# Patient Record
Sex: Male | Born: 2000 | Race: Black or African American | Hispanic: No | Marital: Single | Smoking: Never smoker
Health system: Southern US, Community
[De-identification: ages and names within clinical notes are randomized; demographics above are authoritative.]

---
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
## 2021-08-09 ENCOUNTER — Encounter (HOSPITAL_BASED_OUTPATIENT_CLINIC_OR_DEPARTMENT_OTHER): Payer: Self-pay

## 2021-08-09 ENCOUNTER — Emergency Department (HOSPITAL_BASED_OUTPATIENT_CLINIC_OR_DEPARTMENT_OTHER)
Admission: EM | Admit: 2021-08-09 | Discharge: 2021-08-09 | Disposition: A | Discharge status: Home / Self Care | Payer: Medicaid Other | Attending: Emergency Medicine | Admitting: Emergency Medicine

## 2021-08-09 ENCOUNTER — Other Ambulatory Visit: Payer: Self-pay

## 2021-08-09 DIAGNOSIS — R300 Dysuria: Secondary | ICD-10-CM | POA: Insufficient documentation

## 2021-08-09 LAB — URINALYSIS, ROUTINE W REFLEX MICROSCOPIC
Bilirubin Urine: NEGATIVE
Glucose, UA: NEGATIVE mg/dL
Hgb urine dipstick: NEGATIVE
Ketones, ur: NEGATIVE mg/dL
Leukocytes,Ua: NEGATIVE
Nitrite: NEGATIVE
Protein, ur: NEGATIVE mg/dL
Specific Gravity, Urine: 1.025 (ref 1.005–1.030)
pH: 7.5 (ref 5.0–8.0)

## 2021-08-09 MED ORDER — CEFTRIAXONE SODIUM 500 MG IJ SOLR
500.0000 mg | Freq: Once | INTRAMUSCULAR | Status: AC
  Administered 2021-08-09: 500 mg via INTRAMUSCULAR
  Filled 2021-08-09: qty 500

## 2021-08-09 MED ORDER — DOXYCYCLINE HYCLATE 100 MG PO TABS: 100.0000 mg | ORAL_TABLET | Freq: Two times a day (BID) | ORAL | 0 refills | Status: AC

## 2021-08-09 MED ORDER — LIDOCAINE HCL (PF) 1 % IJ SOLN
1.0000 mL | Freq: Once | INTRAMUSCULAR | Status: AC
  Administered 2021-08-09: 1 mL
  Filled 2021-08-09: qty 5

## 2021-08-09 NOTE — Discharge Instructions (Signed)
You were tested and treated today for gonorrhea and chlamydia. You will be called in the next 2 to 3 days if any results are positive, you can also review negative results online.  You should avoid sexual activity until you have completed all antibiotics.  Please make sure you are using protection when sexually active to help prevent further STDs.  You can go to the health department for any future STD testing needs.

## 2021-08-09 NOTE — ED Triage Notes (Signed)
Pt c/o dysuria x 1 week-feels is STD-denies penile discharge-denies known STD exposure-NAD-steady gait

## 2021-08-09 NOTE — ED Provider Notes (Signed)
[facility] EMERGENCY DEPARTMENT Provider Note   CSN: [id number] Arrival date & time: 08/09/21  1734     History Chief Complaint  Patient presents with   Dysuria    Richard Mcconnell is a 20 y.o. male.  Richard Mcconnell is a 20 y.o. male who is otherwise healthy, presents to the emergency department for evaluation of dysuria.  Patient reports he has been having intermittent burning with urination over the past week.  He is concerned he may have an STD as he has had similar symptoms like this with STDs in the past.  He denies any penile discharge or genital lesions or rashes.  No testicular pain or swelling.  No abdominal pain, nausea, vomiting or fevers.  No rectal pain or pain with bowel movements.  Patient reports he is currently sexually active and does not always use protection.  No history of urinary tract infection.  No other aggravating or alleviating factors.  The history is provided by the patient.      History reviewed. No pertinent past medical history.  There are no problems to display for this patient.   History reviewed. No pertinent surgical history.     No family history on file.  Social History   Tobacco Use   Smoking status: Never   Smokeless tobacco: Never  Vaping Use   Vaping Use: Never used  Substance Use Topics   Alcohol use: Never   Drug use: Never    Home Medications Prior to Admission medications   Medication Sig Start Date End Date Taking? Authorizing Provider  doxycycline (VIBRA-TABS) 100 MG tablet Take 1 tablet (100 mg total) by mouth 2 (two) times daily for 7 days. 08/09/21 08/16/21 Yes Dartha Lodge, PA-C  Guaifenesin 1200 MG TB12 Take 1 tablet (1,200 mg total) by mouth 2 (two) times daily. 01/11/19   Lawyer, Cristal Deer, PA-C  ibuprofen (ADVIL,MOTRIN) 800 MG tablet Take 1 tablet (800 mg total) by mouth every 8 (eight) hours as needed. 01/11/19   Lawyer, Cristal Deer, PA-C  promethazine-dextromethorphan (PROMETHAZINE-DM) 6.25-15 MG/5ML  syrup Take 5-10 mLs by mouth 4 (four) times daily as needed for cough (especially at bedtime). 01/11/19   Lawyer, Cristal Deer, PA-C    Allergies    Patient has no known allergies.  Review of Systems   Review of Systems  Constitutional:  Negative for chills and fever.  Gastrointestinal:  Negative for abdominal pain, nausea, rectal pain and vomiting.  Genitourinary:  Positive for dysuria. Negative for difficulty urinating, flank pain, frequency, genital sores, hematuria, penile discharge, penile pain, scrotal swelling and testicular pain.  Skin:  Negative for rash.  All other systems reviewed and are negative.  Physical Exam Updated Vital Signs BP 113/72 (BP Location: Left Arm)   Pulse 70   Temp 98.4 F (36.9 C) (Oral)   Resp 16   Ht 6' (1.829 m)   Wt 78.5 kg   SpO2 100%   BMI 23.46 kg/m   Physical Exam Vitals and nursing note reviewed. Exam conducted with a chaperone present.  Constitutional:      General: He is not in acute distress.    Appearance: Normal appearance. He is well-developed. He is not ill-appearing or diaphoretic.  HENT:     Head: Normocephalic and atraumatic.  Eyes:     General:        Right eye: No discharge.        Left eye: No discharge.  Pulmonary:     Effort: Pulmonary effort is normal. No respiratory distress.  Abdominal:     General: Bowel sounds are normal. There is no distension.     Palpations: Abdomen is soft. There is no mass.     Tenderness: There is no abdominal tenderness. There is no guarding.  Genitourinary:    Comments: Chaperone present during genital exam. No external genital lesions, no inguinal lymphadenopathy Penis normal without discharge Testicles without tenderness or masses on palpation.  No tenderness over the spermatic cord bilaterally. Skin:    General: Skin is warm and dry.  Neurological:     Mental Status: He is alert and oriented to person, place, and time.     Coordination: Coordination normal.  Psychiatric:         Mood and Affect: Mood normal.        Behavior: Behavior normal.    ED Results / Procedures / Treatments   Labs (all labs ordered are listed, but only abnormal results are displayed) Labs Reviewed  URINALYSIS, ROUTINE W REFLEX MICROSCOPIC  GC/CHLAMYDIA PROBE AMP ([place]) NOT AT [facility]    EKG None  Radiology No results found.  Procedures Procedures   Medications Ordered in ED Medications  cefTRIAXone (ROCEPHIN) injection 500 mg (500 mg Intramuscular Given 08/09/21 1946)  lidocaine (PF) (XYLOCAINE) 1 % injection 1 mL (1 mL Other Given 08/09/21 1946)    ED Course  I have reviewed the triage vital signs and the nursing notes.  Pertinent labs & imaging results that were available during my care of the patient were reviewed by me and considered in my medical decision making (see chart for details).    MDM Rules/Calculators/A&P                           Patient is afebrile without abdominal tenderness, abdominal pain or painful bowel movements to indicate prostatitis.  No tenderness to palpation of the testes or epididymis to suggest orchitis or epididymitis.  STD cultures obtained including gonorrhea and chlamydia.  Patient declined HIV and syphilis testing.  Patient to be discharged with instructions to follow up with PCP. Discussed importance of using protection when sexually active. Pt understands that they have GC/Chlamydia cultures pending and that they will need to inform all sexual partners if results return positive. Patient has been treated prophylactically with doxycycline and Rocephin.    Final Clinical Impression(s) / ED Diagnoses Final diagnoses:  Dysuria    Rx / DC Orders ED Discharge Orders          Ordered    doxycycline (VIBRA-TABS) 100 MG tablet  2 times daily        08/09/21 1938             Dartha Lodge, [place] 08/09/21 Babette Relic    Arby Barrette, MD 08/21/21 [phone]

## 2021-10-20 ENCOUNTER — Emergency Department (INDEPENDENT_AMBULATORY_CARE_PROVIDER_SITE_OTHER)
Admission: EM | Admit: 2021-10-20 | Discharge: 2021-10-20 | Disposition: A | Discharge status: Home / Self Care | Payer: Medicaid Other | Source: Home / Self Care

## 2021-10-20 ENCOUNTER — Encounter: Payer: Self-pay | Admitting: Emergency Medicine

## 2021-10-20 ENCOUNTER — Other Ambulatory Visit: Payer: Self-pay

## 2021-10-20 ENCOUNTER — Other Ambulatory Visit (HOSPITAL_COMMUNITY)
Admission: RE | Admit: 2021-10-20 | Discharge: 2021-10-20 | Disposition: A | Discharge status: Home / Self Care | Payer: Medicaid Other | Source: Ambulatory Visit | Attending: Family Medicine | Admitting: Family Medicine

## 2021-10-20 DIAGNOSIS — Z202 Contact with and (suspected) exposure to infections with a predominantly sexual mode of transmission: Secondary | ICD-10-CM | POA: Insufficient documentation

## 2021-10-20 NOTE — ED Triage Notes (Signed)
Per pt his partner tested positive for chlamydia this past month - was told to get tested  No condoms  No symptoms at this time

## 2021-10-20 NOTE — ED Provider Notes (Signed)
[facility]    CSN: [id number] Arrival date & time: 10/20/21  1723      History   Chief Complaint Chief Complaint  Patient presents with   Exposure to STD    HPI Sajjad Honea is a 20 y.o. male.   HPI 20 year old male presents with concern for potential STD.  Reports his partner tested positive for chlamydia a month ago and was told to get tested.  Patient was treated with doxycycline and Rocephin on 08/09/2021.  Patient denies any current symptoms.  History reviewed. No pertinent past medical history.  There are no problems to display for this patient.   History reviewed. No pertinent surgical history.     Home Medications    Prior to Admission medications   Medication Sig Start Date End Date Taking? Authorizing Provider  Guaifenesin 1200 MG TB12 Take 1 tablet (1,200 mg total) by mouth 2 (two) times daily. Patient not taking: Reported on 10/20/2021 01/11/19   Charlestine Night, PA-C  ibuprofen (ADVIL,MOTRIN) 800 MG tablet Take 1 tablet (800 mg total) by mouth every 8 (eight) hours as needed. Patient not taking: Reported on 10/20/2021 01/11/19   Charlestine Night, PA-C  promethazine-dextromethorphan (PROMETHAZINE-DM) 6.25-15 MG/5ML syrup Take 5-10 mLs by mouth 4 (four) times daily as needed for cough (especially at bedtime). Patient not taking: Reported on 10/20/2021 01/11/19   Charlestine Night, PA-C    Family History Family History  Problem Relation Age of Onset   Liver disease Father     Social History Social History   Tobacco Use   Smoking status: Never   Smokeless tobacco: Never  Vaping Use   Vaping Use: Never used  Substance Use Topics   Alcohol use: Never   Drug use: Never     Allergies   Patient has no known allergies.   Review of Systems Review of Systems  Genitourinary:        Concern for possible STD exposure (GC chlamydia)  All other systems reviewed and are negative.   Physical Exam Triage Vital Signs ED Triage  Vitals  Enc Vitals Group     BP 10/20/21 1744 121/81     Pulse Rate 10/20/21 1744 63     Resp 10/20/21 1744 15     Temp 10/20/21 1744 98.5 F (36.9 C)     Temp Source 10/20/21 1744 Oral     SpO2 10/20/21 1744 100 %     Weight 10/20/21 1746 180 lb (81.6 kg)     Height 10/20/21 1746 6' (1.829 m)     Head Circumference --      Peak Flow --      Pain Score 10/20/21 1746 0     Pain Loc --      Pain Edu? --      Excl. in GC? --    No data found.  Updated Vital Signs BP 121/81 (BP Location: Right Arm)    Pulse 63    Temp 98.5 F (36.9 C) (Oral)    Resp 15    Ht 6' (1.829 m)    Wt 180 lb (81.6 kg)    SpO2 100%    BMI 24.41 kg/m   Physical Exam Vitals and nursing note reviewed.  Constitutional:      Appearance: Normal appearance. He is normal weight.  HENT:     Head: Normocephalic and atraumatic.     Mouth/Throat:     Mouth: Mucous membranes are moist.     Pharynx: Oropharynx is clear.  Eyes:  Extraocular Movements: Extraocular movements intact.     Conjunctiva/sclera: Conjunctivae normal.     Pupils: Pupils are equal, round, and reactive to light.  Cardiovascular:     Rate and Rhythm: Normal rate and regular rhythm.     Pulses: Normal pulses.     Heart sounds: Normal heart sounds.  Pulmonary:     Effort: Pulmonary effort is normal.     Breath sounds: Normal breath sounds.  Musculoskeletal:     Cervical back: Normal range of motion and neck supple.  Skin:    General: Skin is warm and dry.  Neurological:     General: No focal deficit present.     Mental Status: He is alert and oriented to person, place, and time.     UC Treatments / Results  Labs (all labs ordered are listed, but only abnormal results are displayed) Labs Reviewed  CYTOLOGY, (ORAL, ANAL, URETHRAL) ANCILLARY ONLY    EKG   Radiology No results found.  Procedures Procedures (including critical care time)  Medications Ordered in UC Medications - No data to display  Initial Impression /  Assessment and Plan / UC Course  I have reviewed the triage vital signs and the nursing notes.  Pertinent labs & imaging results that were available during my care of the patient were reviewed by me and considered in my medical decision making (see chart for details).    MDM: 1.  Potential exposure to STD-Aptima swab ordered. Advised patient we will follow-up with him once lab results are returned.  Patient discharged home, hemodynamically stable. Final Clinical Impressions(s) / UC Diagnoses   Final diagnoses:  Potential exposure to STD     Discharge Instructions      Advised patient we will follow-up with him once lab results are returned.     ED Prescriptions   None    PDMP not reviewed this encounter.   Trevor Iha, FNP 10/20/21 1827

## 2021-10-20 NOTE — Discharge Instructions (Addendum)
Advised patient we will follow-up with him once lab results are returned.

## 2021-10-24 ENCOUNTER — Telehealth (HOSPITAL_COMMUNITY): Payer: Self-pay | Admitting: Emergency Medicine

## 2021-10-24 LAB — CYTOLOGY, (ORAL, ANAL, URETHRAL) ANCILLARY ONLY
Chlamydia: POSITIVE — AB
Comment: NEGATIVE
Comment: NEGATIVE
Comment: NORMAL
Neisseria Gonorrhea: NEGATIVE
Trichomonas: NEGATIVE

## 2021-10-24 MED ORDER — DOXYCYCLINE HYCLATE 100 MG PO CAPS: 100.0000 mg | ORAL_CAPSULE | Freq: Two times a day (BID) | ORAL | 0 refills | Status: AC

## 2022-03-24 ENCOUNTER — Emergency Department (HOSPITAL_BASED_OUTPATIENT_CLINIC_OR_DEPARTMENT_OTHER)
Admission: EM | Admit: 2022-03-24 | Discharge: 2022-03-24 | Disposition: A | Discharge status: Home / Self Care | Payer: Medicaid Other | Attending: Emergency Medicine | Admitting: Emergency Medicine

## 2022-03-24 ENCOUNTER — Other Ambulatory Visit: Payer: Self-pay

## 2022-03-24 ENCOUNTER — Encounter (HOSPITAL_BASED_OUTPATIENT_CLINIC_OR_DEPARTMENT_OTHER): Payer: Self-pay | Admitting: Emergency Medicine

## 2022-03-24 ENCOUNTER — Emergency Department (HOSPITAL_BASED_OUTPATIENT_CLINIC_OR_DEPARTMENT_OTHER): Payer: Medicaid Other

## 2022-03-24 DIAGNOSIS — S93602A Unspecified sprain of left foot, initial encounter: Secondary | ICD-10-CM

## 2022-03-24 DIAGNOSIS — W208XXA Other cause of strike by thrown, projected or falling object, initial encounter: Secondary | ICD-10-CM | POA: Insufficient documentation

## 2022-03-24 DIAGNOSIS — S99922A Unspecified injury of left foot, initial encounter: Secondary | ICD-10-CM | POA: Diagnosis present

## 2022-03-24 MED ORDER — OXYCODONE-ACETAMINOPHEN 5-325 MG PO TABS
1.0000 | ORAL_TABLET | Freq: Once | ORAL | Status: AC
  Administered 2022-03-24: 1 via ORAL
  Filled 2022-03-24: qty 1

## 2022-03-24 NOTE — Discharge Instructions (Signed)
Recommend following up with a primary care doctor, sports medicine doctor or orthopedics.  Return to ER if you develop uncontrolled pain, inability to walk or other new concerning symptom.  Take Tylenol, Motrin as needed for pain.  Can use the ankle brace as needed for support.

## 2022-03-24 NOTE — ED Provider Notes (Signed)
[facility] EMERGENCY DEPT Provider Note   CSN: [id number] Arrival date & time: 03/24/22  4656     History  Chief Complaint  Patient presents with   Foot Injury    Richard Mcconnell is a 21 y.o. male.  Presents to ER due to concern for foot injury.  Patient states that about 3 weeks ago he had a heavy object strike his right foot.  Reports he went to see medical provider at that time and had an x-ray done and was told it was normal.  Since that time has continued to struggle with pain, pain is in foot, ankle, had an episode where he felt like his foot gave out on him causing him to fall.  Did hit his right knee.  States that he is not having any more pain or discomfort in his right knee.  Pain is only in left ankle and foot right now.  No numbness, weakness.  No medical problems.  Has taken some over-the-counter pain meds with minimal relief.  HPI     Home Medications Prior to Admission medications   Medication Sig Start Date End Date Taking? Authorizing Provider  Guaifenesin 1200 MG TB12 Take 1 tablet (1,200 mg total) by mouth 2 (two) times daily. Patient not taking: Reported on 10/20/2021 01/11/19   Charlestine Night, PA-C  ibuprofen (ADVIL,MOTRIN) 800 MG tablet Take 1 tablet (800 mg total) by mouth every 8 (eight) hours as needed. Patient not taking: Reported on 10/20/2021 01/11/19   Charlestine Night, PA-C  promethazine-dextromethorphan (PROMETHAZINE-DM) 6.25-15 MG/5ML syrup Take 5-10 mLs by mouth 4 (four) times daily as needed for cough (especially at bedtime). Patient not taking: Reported on 10/20/2021 01/11/19   Charlestine Night, PA-C      Allergies    Patient has no known allergies.    Review of Systems   Review of Systems  Musculoskeletal:  Positive for arthralgias.  All other systems reviewed and are negative.  Physical Exam Updated Vital Signs BP 121/70 (BP Location: Right Arm)   Pulse 74   Temp 98.4 F (36.9 C) (Oral)   Resp 18   Ht 6' (1.829 m)    Wt 79.4 kg   SpO2 99%   BMI 23.73 kg/m  Physical Exam Vitals and nursing note reviewed.  Constitutional:      General: He is not in acute distress.    Appearance: He is well-developed.  HENT:     Head: Normocephalic and atraumatic.  Eyes:     Conjunctiva/sclera: Conjunctivae normal.  Cardiovascular:     Rate and Rhythm: Normal rate and regular rhythm.     Heart sounds: No murmur heard. Pulmonary:     Effort: Pulmonary effort is normal. No respiratory distress.     Breath sounds: Normal breath sounds.  Abdominal:     Palpations: Abdomen is soft.     Tenderness: There is no abdominal tenderness.  Musculoskeletal:        General: No swelling.     Cervical back: Neck supple.     Comments: Back: no C, T, L spine TTP, no step off or deformity RUE: no TTP throughout, no deformity, normal joint ROM, radial pulse intact, distal sensation and motor intact LUE: no TTP throughout, no deformity, normal joint ROM, radial pulse intact, distal sensation and motor intact RLE:  no TTP throughout, no deformity, normal joint ROM, distal pulse, sensation and motor intact LLE: Some tenderness to the midfoot, ankle but, no deformity, normal joint ROM, distal pulse, sensation and motor intact  Skin:    General: Skin is warm and dry.     Capillary Refill: Capillary refill takes less than 2 seconds.  Neurological:     Mental Status: He is alert.  Psychiatric:        Mood and Affect: Mood normal.    ED Results / Procedures / Treatments   Labs (all labs ordered are listed, but only abnormal results are displayed) Labs Reviewed - No data to display  EKG None  Radiology DG Ankle Complete Left  Result Date: 03/24/2022 CLINICAL DATA:  Trauma, fall, pain EXAM: LEFT ANKLE COMPLETE - 3+ VIEW COMPARISON:  None Available. FINDINGS: There is no evidence of fracture, dislocation, or joint effusion. There is no evidence of arthropathy or other focal bone abnormality. Soft tissues are unremarkable.  IMPRESSION: No fracture or dislocation is seen in the left ankle. Electronically Signed   By: Ernie Avena M.D.   On: 03/24/2022 09:50   DG Foot Complete Left  Result Date: 03/24/2022 CLINICAL DATA:  Trauma, fall EXAM: LEFT FOOT - COMPLETE 3+ VIEW COMPARISON:  None Available. FINDINGS: No fracture or dislocation is seen in the left foot. IMPRESSION: No fracture or dislocation is seen in the left foot. Electronically Signed   By: Ernie Avena M.D.   On: 03/24/2022 09:49    Procedures Procedures    Medications Ordered in ED Medications  oxyCODONE-acetaminophen (PERCOCET/ROXICET) 5-325 MG per tablet 1 tablet (1 tablet Oral Given 03/24/22 0919)    ED Course/ Medical Decision Making/ A&P                           Medical Decision Making Amount and/or Complexity of Data Reviewed Radiology: ordered.  Risk Prescription drug management.   21 year old presents to ER due to concern for ongoing foot and ankle pain after a pallet jack fell onto his foot 3 weeks ago.  On physical exam there is no significant deformity or swelling noted however he does have tenderness over the foot and ankle.  He also reported falling and hitting his right knee.  No tenderness or deformity to the right knee at this time.  Check plain films, no fracture or dislocation noted in the foot or ankle.  I independently reviewed and interpreted these results.  Given persistence of pain despite the past couple weeks, will provide a ankle brace and advise follow-up with orthopedics or sports medicine or his primary care doctor.  Reviewed return precautions and discharged home with his mother.    After the discussed management above, the patient was determined to be safe for discharge.  The patient was in agreement with this plan and all questions regarding their care were answered.  ED return precautions were discussed and the patient will return to the ED with any significant worsening of  condition.         Final Clinical Impression(s) / ED Diagnoses Final diagnoses:  Foot sprain, left, initial encounter    Rx / DC Orders ED Discharge Orders     None         Milagros Loll, MD 03/24/22 1001

## 2022-03-24 NOTE — ED Triage Notes (Signed)
A Pallet jack fell onto Pt's left foot 3 weeks ago. Yesterday Pt had a mechanical fall due to his foot giving out.  Pt stated that his right knee also hurts.

## 2022-04-03 ENCOUNTER — Ambulatory Visit (INDEPENDENT_AMBULATORY_CARE_PROVIDER_SITE_OTHER): Payer: Managed Care, Other (non HMO) | Admitting: Sports Medicine

## 2022-04-03 VITALS — BP 100/68 | Ht 72.0 in | Wt 175.0 lb

## 2022-04-03 DIAGNOSIS — S93492A Sprain of other ligament of left ankle, initial encounter: Secondary | ICD-10-CM | POA: Diagnosis not present

## 2022-04-03 DIAGNOSIS — M25572 Pain in left ankle and joints of left foot: Secondary | ICD-10-CM
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
## 2022-05-24 ENCOUNTER — Encounter (HOSPITAL_BASED_OUTPATIENT_CLINIC_OR_DEPARTMENT_OTHER): Payer: Self-pay

## 2022-05-24 ENCOUNTER — Emergency Department (HOSPITAL_BASED_OUTPATIENT_CLINIC_OR_DEPARTMENT_OTHER)
Admission: EM | Admit: 2022-05-24 | Discharge: 2022-05-24 | Discharge status: Against Medical Advice | Payer: Managed Care, Other (non HMO) | Attending: Emergency Medicine | Admitting: Emergency Medicine

## 2022-05-24 DIAGNOSIS — M79671 Pain in right foot: Secondary | ICD-10-CM | POA: Diagnosis not present

## 2022-05-24 DIAGNOSIS — M7989 Other specified soft tissue disorders: Secondary | ICD-10-CM | POA: Diagnosis not present

## 2022-05-24 NOTE — ED Notes (Signed)
Called no answer

## 2022-05-24 NOTE — ED Triage Notes (Signed)
States his 3rd toe on rt. Foot is swollen and painful.  Pain for several weeks ? Ingrown toenail

## 2022-08-13 ENCOUNTER — Other Ambulatory Visit: Payer: Self-pay

## 2022-08-13 ENCOUNTER — Emergency Department (HOSPITAL_BASED_OUTPATIENT_CLINIC_OR_DEPARTMENT_OTHER): Payer: Managed Care, Other (non HMO) | Admitting: Radiology

## 2022-08-13 ENCOUNTER — Emergency Department (HOSPITAL_BASED_OUTPATIENT_CLINIC_OR_DEPARTMENT_OTHER)
Admission: EM | Admit: 2022-08-13 | Discharge: 2022-08-13 | Disposition: A | Discharge status: Home / Self Care | Payer: Managed Care, Other (non HMO) | Attending: Emergency Medicine | Admitting: Emergency Medicine

## 2022-08-13 ENCOUNTER — Emergency Department (HOSPITAL_BASED_OUTPATIENT_CLINIC_OR_DEPARTMENT_OTHER): Payer: Managed Care, Other (non HMO)

## 2022-08-13 ENCOUNTER — Encounter (HOSPITAL_BASED_OUTPATIENT_CLINIC_OR_DEPARTMENT_OTHER): Payer: Self-pay | Admitting: Emergency Medicine

## 2022-08-13 DIAGNOSIS — M546 Pain in thoracic spine: Secondary | ICD-10-CM | POA: Insufficient documentation

## 2022-08-13 DIAGNOSIS — R079 Chest pain, unspecified: Secondary | ICD-10-CM | POA: Diagnosis not present

## 2022-08-13 DIAGNOSIS — M549 Dorsalgia, unspecified: Secondary | ICD-10-CM
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
## 2022-08-13 NOTE — ED Notes (Signed)
Dc instructions reviewed with patient. Patient voiced understanding. Dc with belongings.  °
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
## 2024-07-07 ENCOUNTER — Emergency Department (HOSPITAL_BASED_OUTPATIENT_CLINIC_OR_DEPARTMENT_OTHER)
Admission: EM | Admit: 2024-07-07 | Discharge: 2024-07-07 | Disposition: A | Discharge status: Home / Self Care | Attending: Emergency Medicine | Admitting: Emergency Medicine

## 2024-07-07 ENCOUNTER — Other Ambulatory Visit: Payer: Self-pay

## 2024-07-07 ENCOUNTER — Encounter (HOSPITAL_BASED_OUTPATIENT_CLINIC_OR_DEPARTMENT_OTHER): Payer: Self-pay

## 2024-07-07 DIAGNOSIS — Z202 Contact with and (suspected) exposure to infections with a predominantly sexual mode of transmission: Secondary | ICD-10-CM | POA: Diagnosis present

## 2024-07-07 DIAGNOSIS — Z113 Encounter for screening for infections with a predominantly sexual mode of transmission: Secondary | ICD-10-CM

## 2024-07-07 LAB — URINALYSIS, ROUTINE W REFLEX MICROSCOPIC
Bilirubin Urine: NEGATIVE
Glucose, UA: NEGATIVE mg/dL
Hgb urine dipstick: NEGATIVE
Ketones, ur: NEGATIVE mg/dL
Leukocytes,Ua: NEGATIVE
Nitrite: NEGATIVE
Protein, ur: NEGATIVE mg/dL
Specific Gravity, Urine: 1.028 (ref 1.005–1.030)
pH: 7 (ref 5.0–8.0)

## 2024-07-07 LAB — RAPID HIV SCREEN (HIV 1/2 AB+AG)
HIV 1/2 Antibodies: NONREACTIVE
HIV-1 P24 Antigen - HIV24: NONREACTIVE

## 2024-07-07 NOTE — ED Provider Notes (Signed)
 [place] EMERGENCY DEPARTMENT AT [facility] Provider Note   CSN: [id number] Arrival date & time: 07/07/24  8171     Patient presents with: Exposure to STD   Richard Mcconnell is a 23 y.o. male.  23 year old male presents to ED with concern of STD exposure.  Patient advises he had a sexual partner that was diagnosed with bacterial vaginosis and wanted to be tested for STDs.  Patient reports only 1 partner and no other exposures.  Patient's partner has not been diagnosed with STD.  Patient does not have any discharge, swelling, fevers.  Patient reports mild dysuria.     Prior to Admission medications   Medication Sig Start Date End Date Taking? Authorizing Provider  Guaifenesin  1200 MG TB12 Take 1 tablet (1,200 mg total) by mouth 2 (two) times daily. Patient not taking: Reported on 10/20/2021 01/11/19   Tracey Bruckner, PA-C  ibuprofen  (ADVIL ,MOTRIN ) 800 MG tablet Take 1 tablet (800 mg total) by mouth every 8 (eight) hours as needed. Patient not taking: Reported on 10/20/2021 01/11/19   Tracey Bruckner, PA-C  promethazine -dextromethorphan (PROMETHAZINE -DM) 6.25-15 MG/5ML syrup Take 5-10 mLs by mouth 4 (four) times daily as needed for cough (especially at bedtime). Patient not taking: Reported on 10/20/2021 01/11/19   Tracey Bruckner, PA-C    Allergies: Patient has no known allergies.    Review of Systems  Genitourinary:  Positive for dysuria.  All other systems reviewed and are negative.   Updated Vital Signs BP 125/79 (BP Location: Right Arm)   Pulse 61   Temp 97.9 F (36.6 C)   Resp 16   Ht 6' (1.829 m)   Wt 82.6 kg   SpO2 100%   BMI 24.68 kg/m   Physical Exam Vitals and nursing note reviewed.  Constitutional:      Appearance: Normal appearance.  HENT:     Head: Normocephalic and atraumatic.     Nose: Nose normal.  Eyes:     Extraocular Movements: Extraocular movements intact.     Conjunctiva/sclera: Conjunctivae normal.     Pupils: Pupils are  equal, round, and reactive to light.  Cardiovascular:     Rate and Rhythm: Normal rate.  Pulmonary:     Effort: Pulmonary effort is normal. No respiratory distress.  Musculoskeletal:        General: Normal range of motion.     Cervical back: Normal range of motion.  Neurological:     General: No focal deficit present.     Mental Status: He is alert.  Psychiatric:        Mood and Affect: Mood normal.        Behavior: Behavior normal.     (all labs ordered are listed, but only abnormal results are displayed) Labs Reviewed  URINALYSIS, ROUTINE W REFLEX MICROSCOPIC  RAPID HIV SCREEN (HIV 1/2 AB+AG)  RPR  GC/CHLAMYDIA PROBE AMP ([place]) NOT AT [facility]    EKG: None  Radiology: No results found.  23 y.o. male presents to the ED with complaints of dysuria and possible STD exposure, this involves an extensive number of treatment options, and is a complaint that carries with it a high risk of complications and morbidity.  The differential diagnosis includes STD, UTI, pyelonephritis (Ddx)  On arrival pt is nontoxic, vitals unremarkable.    Lab Tests:  I Ordered, reviewed, and interpreted labs, which included: UA, RPR, HIV, GC chlamydia   ED Course:   Patient sitting comfortably in ED bed in no obvious distress.  Patient reports she  has 1 partner that she was diagnosed with BV and he was concerned for STDs and wants to be tested.  Patient denies fevers, headache, nausea, vomiting, dizziness, penile discharge, testicular pain, or penile pain.  Patient reports some mild pain with urination but no burning sensation or smells to urine.  Patient was advised that results will take a few days to return and he will be notified via MyChart or Call for results.  Patient agreed with treatment plan and was comfortable with discharge.    Portions of this note were generated with Scientist, clinical (histocompatibility and immunogenetics). Dictation errors may occur despite best attempts at proofreading.    Final diagnoses:   Screening for STD (sexually transmitted disease)    ED Discharge Orders     None          [name] 07/07/24 2143    Doretha Folks, MD 07/09/24 2103
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
## 2024-07-07 NOTE — Discharge Instructions (Signed)
 Results will be uploaded to my chart in the next couple days.  Logon to MyChart to find results.  If you have any questions feel free to call for your records.  If symptoms arise including fevers, discharge, testicular pain, swelling return to ED for further evaluation.

## 2024-07-08 LAB — RPR: RPR Ser Ql: NONREACTIVE

## 2024-07-09 LAB — GC/CHLAMYDIA PROBE AMP (~~LOC~~) NOT AT ARMC
Chlamydia: NEGATIVE
Comment: NEGATIVE
Comment: NORMAL
Neisseria Gonorrhea: NEGATIVE
